# Patient Record
Sex: Male | Born: 1960 | Race: White | Hispanic: No | Marital: Married | Smoking: Never smoker
Health system: Southern US, Community
[De-identification: ages and names within clinical notes are randomized; demographics above are authoritative.]

---

## 2005-01-09 ENCOUNTER — Ambulatory Visit: Payer: Self-pay | Admitting: Internal Medicine

## 2005-01-19 ENCOUNTER — Ambulatory Visit (HOSPITAL_COMMUNITY): Admission: RE | Admit: 2005-01-19 | Discharge: 2005-01-19 | Payer: Self-pay | Admitting: Internal Medicine

## 2005-02-10 ENCOUNTER — Ambulatory Visit: Payer: Self-pay | Admitting: Internal Medicine

## 2005-02-11 ENCOUNTER — Ambulatory Visit (HOSPITAL_BASED_OUTPATIENT_CLINIC_OR_DEPARTMENT_OTHER): Admission: RE | Admit: 2005-02-11 | Discharge: 2005-02-11 | Payer: Self-pay | Admitting: Internal Medicine

## 2005-02-15 ENCOUNTER — Ambulatory Visit: Payer: Self-pay | Admitting: Internal Medicine

## 2005-02-24 ENCOUNTER — Ambulatory Visit: Payer: Self-pay | Admitting: Internal Medicine

## 2020-03-08 ENCOUNTER — Encounter: Payer: Self-pay | Admitting: Sports Medicine

## 2020-03-08 ENCOUNTER — Ambulatory Visit (INDEPENDENT_AMBULATORY_CARE_PROVIDER_SITE_OTHER): Payer: 59 | Admitting: Sports Medicine

## 2020-03-08 ENCOUNTER — Other Ambulatory Visit: Payer: Self-pay

## 2020-03-08 DIAGNOSIS — M170 Bilateral primary osteoarthritis of knee: Secondary | ICD-10-CM

## 2020-03-08 DIAGNOSIS — M1712 Unilateral primary osteoarthritis, left knee: Secondary | ICD-10-CM | POA: Insufficient documentation

## 2020-03-08 MED ORDER — CELECOXIB 200 MG PO CAPS: ORAL_CAPSULE | ORAL | 2 refills | Status: DC

## 2020-03-08 NOTE — Progress Notes (Signed)
    Procedures performed today:    Procedure: Real-time Ultrasound Guided aspiration/injection of left knee Device: Samsung HS60  Verbal informed consent obtained.  Time-out conducted.  Noted no overlying erythema, induration, or other signs of local infection.  Skin prepped in a sterile fashion.  Local anesthesia: Topical Ethyl chloride.  With sterile technique and under real time ultrasound guidance:  Using 18-gauge needle aspirated approximately 30 mL of clear, straw-colored fluid, syringe switched and 1 cc Kenalog 40, 2 cc lidocaine, 2 cc bupivacaine injected easily Completed without difficulty  Pain immediately resolved suggesting accurate placement of the medication.  Advised to call if fevers/chills, erythema, induration, drainage, or persistent bleeding.  Images permanently stored and available for review in the ultrasound unit.  Impression: Technically successful ultrasound guided injection.  Procedure: Real-time Ultrasound Guided aspiration/injection of right knee Device: Samsung HS60  Verbal informed consent obtained.  Time-out conducted.  Noted no overlying erythema, induration, or other signs of local infection.  Skin prepped in a sterile fashion.  Local anesthesia: Topical Ethyl chloride.  With sterile technique and under real time ultrasound guidance:  Using 18-gauge needle aspirated approximately 30 mL of clear, straw-colored fluid, syringe switched and 1 cc Kenalog 40, 2 cc lidocaine, 2 cc bupivacaine injected easily Completed without difficulty  Pain immediately resolved suggesting accurate placement of the medication.  Advised to call if fevers/chills, erythema, induration, drainage, or persistent bleeding.  Images permanently stored and available for review in the ultrasound unit.  Impression: Technically successful ultrasound guided injection.  Independent interpretation of notes and tests performed by another provider:   None.  Brief History, Exam,  Impression, and Recommendations:    No problem-specific Assessment & Plan notes found for this encounter.    ___________________________________________ [name]. [name], M.D., ABFM., CAQSM. Primary Care and Sports Medicine [facility]  Adjunct Instructor of Family Medicine  [employer]

## 2020-03-08 NOTE — Assessment & Plan Note (Signed)
This is a pleasant 59 year old male, he has a long history of bilateral knee pain, he has had x-rays in the past that showed osteoarthritis. He has had a couple of injections with questionable relief. Really has not done any NSAIDs or analgesics or therapy. He does have significant effusions today so we did aspirations and injections of both knees, I would like him to wear knee sleeves, also when to start Celebrex. Adding formal physical therapy. I like to see him back in a month and if no better we will get him approved for viscosupplementation. We also discussed the utility of PRP should he not respond to viscosupplementation. Ultimately knee arthroplasty is absolute last resort.

## 2020-03-21 ENCOUNTER — Ambulatory Visit: Payer: 59 | Admitting: Rehabilitative and Restorative Service Providers"

## 2020-04-01 ENCOUNTER — Ambulatory Visit: Payer: 59 | Admitting: Physical Therapy

## 2020-04-04 ENCOUNTER — Telehealth: Payer: Self-pay | Admitting: Sports Medicine

## 2020-04-04 ENCOUNTER — Ambulatory Visit (INDEPENDENT_AMBULATORY_CARE_PROVIDER_SITE_OTHER): Payer: 59 | Admitting: Sports Medicine

## 2020-04-04 ENCOUNTER — Other Ambulatory Visit: Payer: Self-pay

## 2020-04-04 DIAGNOSIS — M170 Bilateral primary osteoarthritis of knee: Secondary | ICD-10-CM | POA: Diagnosis not present

## 2020-04-04 MED ORDER — ACETAMINOPHEN ER 650 MG PO TBCR: 650.0000 mg | EXTENDED_RELEASE_TABLET | Freq: Three times a day (TID) | ORAL | 3 refills | Status: AC | PRN

## 2020-04-04 NOTE — Progress Notes (Signed)
    Procedures performed today:    None.  Independent interpretation of notes and tests performed by another provider:   None.  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 At the last visit we did bilateral aspirations and injections, he had good relief for a few weeks, he still has some relief but is having some recurrence of pain to the point where he cannot live with it. He will continue Celebrex which is helpful, add extended release acetaminophen high-dose while we get him approved for viscosupplementation. He can certainly call me if he would like tramadol as well. We did discuss the efficacy of viscosupplementation.     ___________________________________________ [name]. [name], M.D., ABFM., CAQSM. Primary Care and Sports Medicine [facility]  Adjunct Instructor of Family Medicine  [employer]

## 2020-04-04 NOTE — Telephone Encounter (Signed)
Please work on [employer], bilateral, x-ray confirmed, failed steroid injections and NSAIDs.

## 2020-04-04 NOTE — Assessment & Plan Note (Addendum)
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 At the last visit we did bilateral aspirations and injections, he had good relief for a few weeks, he still has some relief but is having some recurrence of pain to the point where he cannot live with it. He will continue Celebrex which is helpful, add extended release acetaminophen high-dose while we get him approved for viscosupplementation. He can certainly call me if he would like tramadol as well. We did discuss the efficacy of viscosupplementation.

## 2020-04-10 NOTE — Telephone Encounter (Signed)
Started Orthovisc Case for approval - CF

## 2020-05-01 MED ORDER — GELSYN-3 16.8 MG/2ML IX SOSY: 1.0000 | PREFILLED_SYRINGE | INTRA_ARTICULAR | 3 refills | Status: DC

## 2020-05-01 NOTE — Addendum Note (Signed)
Addended by: Monica Becton on: 05/01/2020 03:59 PM   Modules accepted: Orders

## 2020-05-01 NOTE — Telephone Encounter (Signed)
Wow, from a month-ish ago, Arline Asp were you not checking on these!?  Thank you Francesco Runner for looking into this.

## 2020-05-01 NOTE — Telephone Encounter (Signed)
Patient had called to check the status of his Orthovisc and I called insurance to see what the hold up was and he will have to try and fail Gelsyn-3 first. Can you please send Steven Dalton to [facility] speciality pharmacy? Please advise.

## 2020-05-03 NOTE — Telephone Encounter (Signed)
Insurance needed updated copy of insurance card and it was faxed.

## 2020-05-07 ENCOUNTER — Other Ambulatory Visit: Payer: Self-pay

## 2020-05-07 DIAGNOSIS — M170 Bilateral primary osteoarthritis of knee: Secondary | ICD-10-CM

## 2020-05-07 MED ORDER — GELSYN-3 16.8 MG/2ML IX SOSY: 1.0000 | PREFILLED_SYRINGE | INTRA_ARTICULAR | 3 refills | Status: DC

## 2020-05-07 NOTE — Telephone Encounter (Signed)
I called [facility] speciality pharmacy and per [facility] they are not a preferred pharmacy. I then was on the phone for about 20 minutes with Theron Arista who told me the speciality pharmacy was [facility]. Per Theron Arista no PA is needed for the injections and a new prescription was sent today. I called the patient and let him know what was going on at this time. No other questions.

## 2020-05-08 NOTE — Telephone Encounter (Signed)
I called and checked on the status of the order since I had received a PA. Per Selena Batten the notes say a PA was needed. I was then transferred to the PA line and then someone disconnected the call. Will try again later.

## 2020-05-09 NOTE — Telephone Encounter (Signed)
[facility] and the prescription has been approved and is set up to ship out to the office next week. Patient is aware and has given the approval to the pharmacy to ship the injections. No other questions.

## 2020-05-16 ENCOUNTER — Ambulatory Visit (INDEPENDENT_AMBULATORY_CARE_PROVIDER_SITE_OTHER): Payer: 59 | Admitting: Sports Medicine

## 2020-05-16 ENCOUNTER — Other Ambulatory Visit: Payer: Self-pay

## 2020-05-16 DIAGNOSIS — S0083XA Contusion of other part of head, initial encounter: Secondary | ICD-10-CM | POA: Diagnosis not present

## 2020-05-16 DIAGNOSIS — M170 Bilateral primary osteoarthritis of knee: Secondary | ICD-10-CM | POA: Diagnosis not present

## 2020-05-16 NOTE — Assessment & Plan Note (Signed)
We have had a fall recently, it sounds as though he became orthostatic. On further questioning he had a clear cardiac catheterization, he did have witnesses, there is no convulsive activity, tongue biting, or incontinence to suggest a seizure. He was taking his Flomax at the time of his fall and it sounds like he stood up too quickly. He will follow this up with his PCP, he could certainly use Cialis with a good Rx coupon for any symptoms of obstructive uropathy rather than Flomax. I did evacuate a large forehead hematoma today.

## 2020-05-16 NOTE — Assessment & Plan Note (Signed)
GelSyn-3 injection #1 of 3 into both knees after aspirations. Return in about 1 week for GelSyn-3 injection #2 of 3.

## 2020-05-16 NOTE — Progress Notes (Signed)
    Procedures performed today:    Procedure: Real-time Ultrasound Guided injection of the right knee Device: Samsung HS60  Verbal informed consent obtained.  Time-out conducted.  Noted no overlying erythema, induration, or other signs of local infection.  Skin prepped in a sterile fashion.  Local anesthesia: Topical Ethyl chloride.  With sterile technique and under real time ultrasound guidance:  Using an 18-gauge needle aspirated 35 cc of clear, straw-colored fluid, syringe switched and 1 syringe of GelSyn-3 injected through a 18-gauge needle.   Completed without difficulty  Pain immediately resolved suggesting accurate placement of the medication.  Advised to call if fevers/chills, erythema, induration, drainage, or persistent bleeding.  Images permanently stored and available for review in the ultrasound unit.  Impression: Technically successful ultrasound guided injection.  Procedure: Real-time Ultrasound Guided injection of the left knee Device: Samsung HS60  Verbal informed consent obtained.  Time-out conducted.  Noted no overlying erythema, induration, or other signs of local infection.  Skin prepped in a sterile fashion.  Local anesthesia: Topical Ethyl chloride.  With sterile technique and under real time ultrasound guidance:  Using an 18-gauge needle aspirated 55 cc of clear, straw-colored fluid, syringe switched and 1 syringe of GelSyn-3 injected through a 18-gauge needle.   Completed without difficulty  Pain immediately resolved suggesting accurate placement of the medication.  Advised to call if fevers/chills, erythema, induration, drainage, or persistent bleeding.  Images permanently stored and available for review in the ultrasound unit.  Impression: Technically successful ultrasound guided injection.  Hematoma evacuation right forehead Risks, benefits, and alternatives explained and consent obtained. Time out conducted. Surface cleaned with alcohol. 3cc lidocaine  with epinephine infiltrated around hematoma. Adequate anesthesia ensured. Area prepped and draped in a sterile fashion. #11 blade used to make a stab incision into hematoma. Curved hemostat used to explore 4 quadrants and loculations broken up. A large amount of congealed hematoma was expressed, the incision was closed with Dermabond. Hemostasis achieved. Pt stable. Compressive head wrap applied. Aftercare and follow-up advised.  Independent interpretation of notes and tests performed by another provider:   None.  Brief History, Exam, Impression, and Recommendations:    Primary osteoarthritis of both knees GelSyn-3 injection #1 of 3 into both knees after aspirations. Return in about 1 week for GelSyn-3 injection #2 of 3.  Traumatic hematoma of forehead We have had a fall recently, it sounds as though he became orthostatic. On further questioning he had a clear cardiac catheterization, he did have witnesses, there is no convulsive activity, tongue biting, or incontinence to suggest a seizure. He was taking his Flomax at the time of his fall and it sounds like he stood up too quickly. He will follow this up with his PCP, he could certainly use Cialis with a good Rx coupon for any symptoms of obstructive uropathy rather than Flomax. I did evacuate a large forehead hematoma today.    ___________________________________________ Steven Dalton. Steven Dalton, M.D., ABFM., CAQSM. Primary Care and Sports Medicine [facility]  Adjunct Instructor of Family Medicine  [employer]

## 2020-05-30 ENCOUNTER — Ambulatory Visit (INDEPENDENT_AMBULATORY_CARE_PROVIDER_SITE_OTHER): Payer: 59

## 2020-05-30 ENCOUNTER — Ambulatory Visit (INDEPENDENT_AMBULATORY_CARE_PROVIDER_SITE_OTHER): Payer: 59 | Admitting: Sports Medicine

## 2020-05-30 ENCOUNTER — Other Ambulatory Visit: Payer: Self-pay

## 2020-05-30 DIAGNOSIS — S0083XD Contusion of other part of head, subsequent encounter: Secondary | ICD-10-CM | POA: Diagnosis not present

## 2020-05-30 DIAGNOSIS — M170 Bilateral primary osteoarthritis of knee: Secondary | ICD-10-CM

## 2020-05-30 MED ORDER — TRAMADOL HCL 50 MG PO TABS: 50.0000 mg | ORAL_TABLET | Freq: Three times a day (TID) | ORAL | 0 refills | Status: DC | PRN

## 2020-05-30 NOTE — Assessment & Plan Note (Signed)
At the last visit I performed an incision and evacuation of a traumatic forehead hematoma, this is doing extremely well.

## 2020-05-30 NOTE — Assessment & Plan Note (Addendum)
Aspiration and GelSyn-3 injection #2 of 3 both knees, return in 1 week for #3 of 3. We will also be bumping him up to tramadol for pain

## 2020-05-30 NOTE — Progress Notes (Addendum)
    Procedures performed today:    Procedure: Real-time Ultrasound Guidedinjection of the right knee Device: Samsung HS60  Verbal informed consent obtained.  Time-out conducted.  Noted no overlying erythema, induration, or other signs of local infection.  Skin prepped in a sterile fashion.  Local anesthesia: Topical Ethyl chloride.  With sterile technique and under real time ultrasound guidance: Using an 18-gauge needle aspirated 25 cc of clear, straw-colored fluid, syringe switched and 1 syringe of GelSyn-3 injected through a 18-gauge needle.   Completed without difficulty  Pain immediately resolved suggesting accurate placement of the medication.  Advised to call if fevers/chills, erythema, induration, drainage, or persistent bleeding.  Images permanently stored and available for review in the ultrasound unit.  Impression: Technically successful ultrasound guided injection.  Procedure: Real-time Ultrasound Guidedinjection of the left knee Device: Samsung HS60  Verbal informed consent obtained.  Time-out conducted.  Noted no overlying erythema, induration, or other signs of local infection.  Skin prepped in a sterile fashion.  Local anesthesia: Topical Ethyl chloride.  With sterile technique and under real time ultrasound guidance: Using an 18-gauge needle aspirated 45 cc of clear, straw-colored fluid, syringe switched and 1 syringe of GelSyn-3 injected through a 18-gauge needle.   Completed without difficulty  Pain immediately resolved suggesting accurate placement of the medication.  Advised to call if fevers/chills, erythema, induration, drainage, or persistent bleeding.  Images permanently stored and available for review in the ultrasound unit.  Impression: Technically successful ultrasound guided injection.  Independent interpretation of notes and tests performed by another provider:   None.  Brief History, Exam, Impression, and Recommendations:    Primary  osteoarthritis of both knees Aspiration and GelSyn-3 injection #2 of 3 both knees, return in 1 week for #3 of 3. We will also be bumping him up to tramadol for pain  Traumatic hematoma of forehead At the last visit I performed an incision and evacuation of a traumatic forehead hematoma, this is doing extremely well.    ___________________________________________ [name]. [name], M.D., ABFM., CAQSM. Primary Care and Sports Medicine [facility]  Adjunct Instructor of Family Medicine  [employer]

## 2020-05-30 NOTE — Addendum Note (Signed)
Addended by: Monica Becton on: 05/30/2020 02:17 PM   Modules accepted: Orders

## 2020-06-06 ENCOUNTER — Ambulatory Visit (INDEPENDENT_AMBULATORY_CARE_PROVIDER_SITE_OTHER): Payer: 59 | Admitting: Sports Medicine

## 2020-06-06 ENCOUNTER — Ambulatory Visit (INDEPENDENT_AMBULATORY_CARE_PROVIDER_SITE_OTHER): Payer: 59

## 2020-06-06 DIAGNOSIS — M170 Bilateral primary osteoarthritis of knee: Secondary | ICD-10-CM

## 2020-06-06 NOTE — Progress Notes (Signed)
    Procedures performed today:    Procedure: Real-time Ultrasound Guidedinjection of theright knee Device: Samsung HS60  Verbal informed consent obtained.  Time-out conducted.  Noted no overlying erythema, induration, or other signs of local infection.  Skin prepped in a sterile fashion.  Local anesthesia: Topical Ethyl chloride.  With sterile technique and under real time ultrasound guidance:Using an 18-gauge needle aspirated 32 cc of clear, straw-colored fluid, syringe switched and 1 syringe of GelSyn-3 injected through a 18-gauge needle. Completed without difficulty  Pain immediately resolved suggesting accurate placement of the medication.  Advised to call if fevers/chills, erythema, induration, drainage, or persistent bleeding.  Images permanently stored and available for review in the ultrasound unit.  Impression: Technically successful ultrasound guided injection.  Procedure: Real-time Ultrasound Guidedinjection of theleftknee Device: Samsung HS60  Verbal informed consent obtained.  Time-out conducted.  Noted no overlying erythema, induration, or other signs of local infection.  Skin prepped in a sterile fashion.  Local anesthesia: Topical Ethyl chloride.  With sterile technique and under real time ultrasound guidance:Using an 18-gauge needle aspirated 36 cc of clear, straw-colored fluid, syringe switched and 1 syringe of GelSyn-3 injected through a18-gauge needle. Completed without difficulty  Pain immediately resolved suggesting accurate placement of the medication.  Advised to call if fevers/chills, erythema, induration, drainage, or persistent bleeding.  Images permanently stored and available for review in the ultrasound unit.  Impression: Technically successful ultrasound guided injection.  Independent interpretation of notes and tests performed by another provider:   None.  Brief History, Exam, Impression, and Recommendations:    Primary  osteoarthritis of both knees Aspiration and GelSyn-3 injection #3 of 3 into both knees, return in 1 month, feeling about the same. He does understand that knee arthroplasty is the next step if this does not improve his symptoms sufficiently.    ___________________________________________ Steven Dalton. [name], M.D., ABFM., CAQSM. Primary Care and Sports Medicine [facility] [facility]  Adjunct Instructor of Family Medicine  [employer]

## 2020-06-06 NOTE — Assessment & Plan Note (Signed)
Aspiration and GelSyn-3 injection #3 of 3 into both knees, return in 1 month, feeling about the same. He does understand that knee arthroplasty is the next step if this does not improve his symptoms sufficiently.

## 2020-06-07 ENCOUNTER — Other Ambulatory Visit: Payer: Self-pay | Admitting: Sports Medicine

## 2020-06-07 DIAGNOSIS — M170 Bilateral primary osteoarthritis of knee: Secondary | ICD-10-CM

## 2020-07-04 ENCOUNTER — Ambulatory Visit: Payer: 59 | Admitting: Sports Medicine

## 2020-07-12 ENCOUNTER — Ambulatory Visit (INDEPENDENT_AMBULATORY_CARE_PROVIDER_SITE_OTHER): Payer: 59 | Admitting: Sports Medicine

## 2020-07-12 ENCOUNTER — Ambulatory Visit (INDEPENDENT_AMBULATORY_CARE_PROVIDER_SITE_OTHER): Payer: 59

## 2020-07-12 DIAGNOSIS — M170 Bilateral primary osteoarthritis of knee: Secondary | ICD-10-CM | POA: Diagnosis not present

## 2020-07-12 NOTE — Assessment & Plan Note (Signed)
We tried everything with Steven Dalton, he has had steroid injections as well as GelSyn-3 series, still feeling about the same, we performed bilateral aspirations and injections with steroid today, referring him for genicular radiofrequency ablation with Dr. Laurian Brim, he is adamant to stay away from arthroplasty.

## 2020-07-12 NOTE — Progress Notes (Signed)
    Procedures performed today:    Procedure: Real-time Ultrasound Guided aspiration/injection of left knee Device: Samsung HS60  Verbal informed consent obtained.  Time-out conducted.  Noted no overlying erythema, induration, or other signs of local infection.  Skin prepped in a sterile fashion.  Local anesthesia: Topical Ethyl chloride.  With sterile technique and under real time ultrasound guidance:  Using 18-gauge needle aspirated 60 cc of blood, syringe switched and 1 cc Kenalog 40, 2 cc lidocaine, 2 cc bupivacaine injected easily Completed without difficulty  Pain immediately resolved suggesting accurate placement of the medication.  Advised to call if fevers/chills, erythema, induration, drainage, or persistent bleeding.  Images permanently stored and available for review in PACS.  Impression: Technically successful ultrasound guided injection.  Procedure: Real-time Ultrasound Guided aspiration/injection of right knee Device: Samsung HS60  Verbal informed consent obtained.  Time-out conducted.  Noted no overlying erythema, induration, or other signs of local infection.  Skin prepped in a sterile fashion.  Local anesthesia: Topical Ethyl chloride.  With sterile technique and under real time ultrasound guidance:  Using 18-gauge needle aspirated 20 mL of clear, straw-colored fluid, syringe switched and 1 cc Kenalog 40, 2 cc lidocaine, 2 cc bupivacaine injected easily Completed without difficulty  Pain immediately resolved suggesting accurate placement of the medication.  Advised to call if fevers/chills, erythema, induration, drainage, or persistent bleeding.  Images permanently stored and available for review in PACS.  Impression: Technically successful ultrasound guided injection.  Independent interpretation of notes and tests performed by another provider:   None.  Brief History, Exam, Impression, and Recommendations:    Primary osteoarthritis of both knees We tried  everything with Theodoro Grist, he has had steroid injections as well as GelSyn-3 series, still feeling about the same, we performed bilateral aspirations and injections with steroid today, referring him for genicular radiofrequency ablation with Dr. Laurian Brim, he is adamant to stay away from arthroplasty.    ___________________________________________ [name]. [name], M.D., ABFM., CAQSM. Primary Care and Sports Medicine [place] [facility]  Adjunct Instructor of Family Medicine  [employer]

## 2020-09-02 ENCOUNTER — Other Ambulatory Visit: Payer: Self-pay | Admitting: Sports Medicine

## 2020-09-02 DIAGNOSIS — M170 Bilateral primary osteoarthritis of knee: Secondary | ICD-10-CM

## 2020-12-07 ENCOUNTER — Other Ambulatory Visit: Payer: Self-pay | Admitting: Sports Medicine

## 2020-12-07 DIAGNOSIS — M170 Bilateral primary osteoarthritis of knee: Secondary | ICD-10-CM

## 2021-02-07 ENCOUNTER — Other Ambulatory Visit: Payer: Self-pay

## 2021-02-07 ENCOUNTER — Ambulatory Visit (INDEPENDENT_AMBULATORY_CARE_PROVIDER_SITE_OTHER): Payer: 59

## 2021-02-07 ENCOUNTER — Ambulatory Visit (INDEPENDENT_AMBULATORY_CARE_PROVIDER_SITE_OTHER): Payer: 59 | Admitting: Sports Medicine

## 2021-02-07 DIAGNOSIS — M170 Bilateral primary osteoarthritis of knee: Secondary | ICD-10-CM

## 2021-02-07 NOTE — Assessment & Plan Note (Signed)
This is a pleasant 60 year old male with known bilateral knee osteoarthritis, previously aspirated and injected in October of last year, did well until recently, now with recurrence of pain, stiffness, swelling, repeat bilateral aspiration and injection today, return to see me on an as-needed basis.

## 2021-02-07 NOTE — Progress Notes (Signed)
    Procedures performed today:    Procedure: Real-time Ultrasound Guidedaspiration/injection of left knee Device: Samsung HS60  Verbal informed consent obtained.  Time-out conducted.  Noted no overlying erythema, induration, or other signs of local infection.  Skin prepped in a sterile fashion.  Local anesthesia: Topical Ethyl chloride.  With sterile technique and under real time ultrasound guidance:  Noted effusion.  Using 18-gauge needle aspirated 31 cc of blood, syringe switched and 1 cc Kenalog 40, 2 cc lidocaine, 2 cc bupivacaine injected easily Completed without difficulty  Pain immediately resolved suggesting accurate placement of the medication.  Advised to call if fevers/chills, erythema, induration, drainage, or persistent bleeding.  Images permanently stored and available for review in PACS.  Impression: Technically successful ultrasound guided injection.  Procedure: Real-time Ultrasound Guidedaspiration/injection of right knee Device: Samsung HS60  Verbal informed consent obtained.  Time-out conducted.  Noted no overlying erythema, induration, or other signs of local infection.  Skin prepped in a sterile fashion.  Local anesthesia: Topical Ethyl chloride.  With sterile technique and under real time ultrasound guidance:  Noted effusion.  Using 18-gauge needle aspirated 35 mL of clear, straw-colored fluid, syringe switched and 1 cc Kenalog 40, 2 cc lidocaine, 2 cc bupivacaine injected easily Completed without difficulty  Pain immediately resolved suggesting accurate placement of the medication.  Advised to call if fevers/chills, erythema, induration, drainage, or persistent bleeding.  Images permanently stored and available for review in PACS.  Impression: Technically successful ultrasound guided injection.  Independent interpretation of notes and tests performed by another provider:   None.  Brief History, Exam, Impression, and Recommendations:    Primary  osteoarthritis of both knees This is a pleasant 60 year old male with known bilateral knee osteoarthritis, previously aspirated and injected in October of last year, did well until recently, now with recurrence of pain, stiffness, swelling, repeat bilateral aspiration and injection today, return to see me on an as-needed basis.    ___________________________________________ [name]. [name], M.D., ABFM., CAQSM. Primary Care and Sports Medicine [facility] [facility]  Adjunct Instructor of Family Medicine  [employer]

## 2021-02-07 NOTE — Patient Instructions (Signed)
Talk to your primary care provider about phentermine and Plenity

## 2021-03-31 ENCOUNTER — Other Ambulatory Visit: Payer: Self-pay | Admitting: Sports Medicine

## 2021-03-31 DIAGNOSIS — M170 Bilateral primary osteoarthritis of knee: Secondary | ICD-10-CM

## 2021-06-19 ENCOUNTER — Other Ambulatory Visit: Payer: Self-pay

## 2021-06-19 ENCOUNTER — Ambulatory Visit (INDEPENDENT_AMBULATORY_CARE_PROVIDER_SITE_OTHER): Payer: 59 | Admitting: Sports Medicine

## 2021-06-19 ENCOUNTER — Ambulatory Visit (INDEPENDENT_AMBULATORY_CARE_PROVIDER_SITE_OTHER): Payer: 59

## 2021-06-19 DIAGNOSIS — M170 Bilateral primary osteoarthritis of knee: Secondary | ICD-10-CM

## 2021-06-19 DIAGNOSIS — G5603 Carpal tunnel syndrome, bilateral upper limbs: Secondary | ICD-10-CM

## 2021-06-19 NOTE — Progress Notes (Signed)
    Procedures performed today:    Procedure: Real-time Ultrasound Guided aspiration/injection of left knee Device: Samsung HS60  Verbal informed consent obtained.  Time-out conducted.  Noted no overlying erythema, induration, or other signs of local infection.  Skin prepped in a sterile fashion.  Local anesthesia: Topical Ethyl chloride.  With sterile technique and under real time ultrasound guidance:   Noted effusion.  Using 18-gauge needle aspirated 20 mL of clear, straw-colored fluid, syringe switched and 1 cc Kenalog 40, 2 cc lidocaine, 2 cc bupivacaine injected easily Completed without difficulty  Pain immediately resolved suggesting accurate placement of the medication.  Advised to call if fevers/chills, erythema, induration, drainage, or persistent bleeding.  Images permanently stored and available for review in PACS.  Impression: Technically successful ultrasound guided injection.   Procedure: Real-time Ultrasound Guided aspiration/injection of right knee Device: Samsung HS60  Verbal informed consent obtained.  Time-out conducted.  Noted no overlying erythema, induration, or other signs of local infection.  Skin prepped in a sterile fashion.  Local anesthesia: Topical Ethyl chloride.  With sterile technique and under real time ultrasound guidance:   Noted effusion.  Using 18-gauge needle aspirated 25 mL of clear, straw-colored fluid, syringe switched and 1 cc Kenalog 40, 2 cc lidocaine, 2 cc bupivacaine injected easily Completed without difficulty  Pain immediately resolved suggesting accurate placement of the medication.  Advised to call if fevers/chills, erythema, induration, drainage, or persistent bleeding.  Images permanently stored and available for review in PACS.  Impression: Technically successful ultrasound guided injection.  Independent interpretation of notes and tests performed by another provider:   None.  Brief History, Exam, Impression, and  Recommendations:    Primary osteoarthritis of both knees Steven Dalton is a pleasant 60 year old male, he has known knee osteoarthritis, last aspirated and injected in April of this year, he also saw Dr. Laurian Brim for genicular radiofrequency ablation. He is having recurrence of swelling with minimal pain, he is going on vacation and would like repeat aspiration injection today, this was performed bilaterally, return to see me as needed.  Carpal tunnel syndrome, bilateral Long history of hands going numb when typing and riding his motorcycle, he will do nighttime splinting for 6 weeks, carpal tunnel conditioning exercises. If insufficient improvement we can consider bilateral median nerve Hydro dissections.    ___________________________________________ [name]. [name], M.D., ABFM., CAQSM. Primary Care and Sports Medicine  [facility]  Adjunct Instructor of Family Medicine  [employer]

## 2021-06-19 NOTE — Assessment & Plan Note (Signed)
Steven Dalton is a pleasant 60 year old male, he has known knee osteoarthritis, last aspirated and injected in April of this year, he also saw Dr. Laurian Brim for genicular radiofrequency ablation. He is having recurrence of swelling with minimal pain, he is going on vacation and would like repeat aspiration injection today, this was performed bilaterally, return to see me as needed.

## 2021-06-19 NOTE — Assessment & Plan Note (Signed)
Long history of hands going numb when typing and riding his motorcycle, he will do nighttime splinting for 6 weeks, carpal tunnel conditioning exercises. If insufficient improvement we can consider bilateral median nerve Hydro dissections.

## 2021-06-30 ENCOUNTER — Other Ambulatory Visit: Payer: Self-pay | Admitting: Sports Medicine

## 2021-06-30 DIAGNOSIS — M170 Bilateral primary osteoarthritis of knee: Secondary | ICD-10-CM

## 2021-08-27 ENCOUNTER — Ambulatory Visit (INDEPENDENT_AMBULATORY_CARE_PROVIDER_SITE_OTHER): Payer: 59

## 2021-08-27 ENCOUNTER — Ambulatory Visit (INDEPENDENT_AMBULATORY_CARE_PROVIDER_SITE_OTHER): Payer: 59 | Admitting: Sports Medicine

## 2021-08-27 ENCOUNTER — Other Ambulatory Visit: Payer: Self-pay

## 2021-08-27 DIAGNOSIS — M170 Bilateral primary osteoarthritis of knee: Secondary | ICD-10-CM

## 2021-08-27 NOTE — Assessment & Plan Note (Addendum)
Known bilateral knee osteoarthritis, multiple aspirations and injections in the past, we did set him up with Dr. Laurian Brim for genicular radiofrequency ablation, he did have some relief, but not long-lasting, I would like him to talk to Dr. Laurian Brim again about repeat ablation, hopefully we can get longer lasting effect. I did aspirate his right knee, no injection as we just did this 2 months ago. I would also like a second opinion from one of our surgeons. We did discuss viscosupplementation and PRP injection as alternatives.

## 2021-08-27 NOTE — Progress Notes (Signed)
    Procedures performed today:    Procedure: Real-time Ultrasound Guided aspiration of right knee Device: Samsung HS60  Verbal informed consent obtained.  Time-out conducted.  Noted no overlying erythema, induration, or other signs of local infection.  Skin prepped in a sterile fashion.  Local anesthesia: Topical Ethyl chloride.  With sterile technique and under real time ultrasound guidance: Noted effusion, using 18-gauge needle aspirated 22 mils of clear, straw-colored fluid. Completed without difficulty  Advised to call if fevers/chills, erythema, induration, drainage, or persistent bleeding.  Images permanently stored and available for review in PACS.  Impression: Technically successful ultrasound guided therapeutic aspiration.  Independent interpretation of notes and tests performed by another provider:   None.  Brief History, Exam, Impression, and Recommendations:    Primary osteoarthritis of both knees Known bilateral knee osteoarthritis, multiple aspirations and injections in the past, we did set him up with Dr. Laurian Brim for genicular radiofrequency ablation, he did have some relief, but not long-lasting, I would like him to talk to Dr. Laurian Brim again about repeat ablation, hopefully we can get longer lasting effect. I did aspirate his right knee, no injection as we just did this 2 months ago. I would also like a second opinion from one of our surgeons. We did discuss viscosupplementation and PRP injection as alternatives.    ___________________________________________ [name]. [name], M.D., ABFM., CAQSM. Primary Care and Sports Medicine [place] [facility]  Adjunct Instructor of Family Medicine  [employer]

## 2021-10-05 ENCOUNTER — Other Ambulatory Visit: Payer: Self-pay | Admitting: Sports Medicine

## 2021-10-05 DIAGNOSIS — M170 Bilateral primary osteoarthritis of knee: Secondary | ICD-10-CM

## 2022-01-07 ENCOUNTER — Other Ambulatory Visit: Payer: Self-pay | Admitting: Sports Medicine

## 2022-01-07 DIAGNOSIS — M170 Bilateral primary osteoarthritis of knee: Secondary | ICD-10-CM

## 2022-02-02 ENCOUNTER — Ambulatory Visit (INDEPENDENT_AMBULATORY_CARE_PROVIDER_SITE_OTHER): Payer: 59 | Admitting: Sports Medicine

## 2022-02-02 ENCOUNTER — Ambulatory Visit (INDEPENDENT_AMBULATORY_CARE_PROVIDER_SITE_OTHER): Payer: 59

## 2022-02-02 ENCOUNTER — Telehealth: Payer: Self-pay | Admitting: Sports Medicine

## 2022-02-02 DIAGNOSIS — M170 Bilateral primary osteoarthritis of knee: Secondary | ICD-10-CM

## 2022-02-02 NOTE — Progress Notes (Signed)
? ? ?  Procedures performed today:   ? ?Procedure: Real-time Ultrasound Guided aspiration of right knee ?Device: Samsung HS60  ?Verbal informed consent obtained.  ?Time-out conducted.  ?Noted no overlying erythema, induration, or other signs of local infection.  ?Skin prepped in a sterile fashion.  ?Local anesthesia: Topical Ethyl chloride.  ?With sterile technique and under real time ultrasound guidance: Noted effusion, using 18-gauge needle aspirated 40 mils of clear, straw-colored fluid, syringe switched and 1 cc kenalog 40, 2 cc lidocaine, 2 cc bupivacaine injected easily. ?Completed without difficulty  ?Advised to call if fevers/chills, erythema, induration, drainage, or persistent bleeding.  ?Images permanently stored and available for review in PACS.  ?Impression: Technically successful ultrasound guided therapeutic aspiration/injection. ? ?Procedure: Real-time Ultrasound Guided injection of left knee ?Device: Samsung HS60  ?Verbal informed consent obtained.  ?Time-out conducted.  ?Noted no overlying erythema, induration, or other signs of local infection.  ?Skin prepped in a sterile fashion.  ?Local anesthesia: Topical Ethyl chloride.  ?With sterile technique and under real time ultrasound guidance: Noted trace effusion, 1 cc kenalog 40, 2 cc lidocaine, 2 cc bupivacaine injected easily. ?Completed without difficulty  ?Advised to call if fevers/chills, erythema, induration, drainage, or persistent bleeding.  ?Images permanently stored and available for review in PACS.  ?Impression: Technically successful ultrasound guided therapeutic injection. ? ?Independent interpretation of notes and tests performed by another provider:  ? ?None. ? ?Brief History, Exam, Impression, and Recommendations:   ? ?Primary osteoarthritis of both knees ?Bilateral knee injection today, last done in November 2022. ?He is interested in viscosupplementation bilateral, for approval purposes he has had greater than 6 weeks of physician  directed conservative treatment, bilateral x-ray confirmation of the arthritis, steroid injections, NSAIDs all without sufficient improvement, we can start Orthovisc or other viscosupplement when approved.  ? ? ? ? ?___________________________________________ ?Steven Dalton. Steven Dalton, M.D., ABFM., CAQSM. ?Primary Care and Sports Medicine ?[facility] ? ?Adjunct Instructor of Family Medicine  ?[employer] ?

## 2022-02-02 NOTE — Assessment & Plan Note (Addendum)
Bilateral knee injection today, last done in November 2022. ?He is interested in viscosupplementation bilateral, for approval purposes he has had greater than 6 weeks of physician directed conservative treatment, bilateral x-ray confirmation of the arthritis, steroid injections, NSAIDs all without sufficient improvement, we can start Orthovisc or other viscosupplement when approved.  ? ?

## 2022-02-02 NOTE — Telephone Encounter (Signed)
Please work on [name]. He is interested in viscosupplementation bilateral, for approval purposes he has had greater than 6 weeks of physician directed conservative treatment, bilateral x-ray confirmation of the arthritis, steroid injections, NSAIDs all without sufficient improvement. ?

## 2022-02-03 NOTE — Telephone Encounter (Signed)
[employer] paperwork faxed to [employer] at [phone] °Request is for Orthovisc or Monovisc °Pt's insurance prefers Orthovisc °Fax confirmation receipt received  °

## 2022-02-04 MED ORDER — EUFLEXXA 20 MG/2ML IX SOSY: PREFILLED_SYRINGE | INTRA_ARTICULAR | 0 refills | Status: AC

## 2022-02-04 NOTE — Telephone Encounter (Signed)
Benefits Investigation Details received from [employer] ?Injection: Euflexxa ? ?Medical: Deductible applies. Since the deductible has been met, patient is responsible for coinsurance. Once the OOP has been met, patient is covered at 100%. Prior authorization is required for the drug through [facility]. To initiate, submit online at [email or web address] ? ?PA required: Yes ?PA submitted to [email or web address]; approval received. Auth # [id number]  ? ? ?Pharmacy: The product is not covered under the pharmacy plan.  ? ?Specialty Pharmacy: [facility] ? ?May fill through: Specialty Pharmacy ?OV Copay/Coinsurance: 0% ?Product Copay: 0% ?Administration Coinsurance: 0% ?Administration Copay:  ?Deductible: [id number] (met: $0289) ?Out of Pocket Max: $3300 (met: $3300)   ? ?Prescription queued up to send to specialty pharmacy, please review for accuracy prior to sending. He is bilateral injections.  ?

## 2022-02-04 NOTE — Addendum Note (Signed)
Addended by: Silverio Decamp on: 02/04/2022 01:02 PM ? ? Modules accepted: Orders ? ?

## 2022-02-04 NOTE — Telephone Encounter (Signed)
Sent!

## 2022-02-04 NOTE — Addendum Note (Signed)
Addended by: Carolin Coy on: 02/04/2022 12:39 PM ? ? Modules accepted: Orders ? ?

## 2022-02-26 NOTE — Telephone Encounter (Signed)
Spoke with specialty pharmacy. Gave patient contact number to reach pharmacy for shipment authorization.

## 2022-03-18 ENCOUNTER — Ambulatory Visit (INDEPENDENT_AMBULATORY_CARE_PROVIDER_SITE_OTHER): Payer: 59

## 2022-03-18 ENCOUNTER — Ambulatory Visit (INDEPENDENT_AMBULATORY_CARE_PROVIDER_SITE_OTHER): Payer: 59 | Admitting: Sports Medicine

## 2022-03-18 DIAGNOSIS — M170 Bilateral primary osteoarthritis of knee: Secondary | ICD-10-CM | POA: Diagnosis not present

## 2022-03-18 NOTE — Assessment & Plan Note (Signed)
Steven Dalton returns, he is a very pleasant 61 year old male, he last had a bilateral knee steroid injection in April of this year, we got him approved for Visco and we got his Euflexxa. Euflexxa injection #1 of 3 into both knees, return in 1 week for #2 of 3.

## 2022-03-18 NOTE — Progress Notes (Signed)
    Procedures performed today:    Procedure: Real-time Ultrasound Guided injection of the left knee Device: Samsung HS60  Verbal informed consent obtained.  Time-out conducted.  Noted no overlying erythema, induration, or other signs of local infection.  Skin prepped in a sterile fashion.  Local anesthesia: Topical Ethyl chloride.  With sterile technique and under real time ultrasound guidance: Noted mild effusion, 1 syringe of Euflexxa injected into the suprapatellar recess via a 22-gauge needle. Completed without difficulty  Advised to call if fevers/chills, erythema, induration, drainage, or persistent bleeding.  Images permanently stored and available for review in PACS.  Impression: Technically successful ultrasound guided injection.  Procedure: Real-time Ultrasound Guided aspiration/injection of the right knee Device: Samsung HS60  Verbal informed consent obtained.  Time-out conducted.  Noted no overlying erythema, induration, or other signs of local infection.  Skin prepped in a sterile fashion.  Local anesthesia: Topical Ethyl chloride.  With sterile technique and under real time ultrasound guidance: Noted large effusion, aspirated 40 mL of clear, straw-colored fluid, syringe switched and 1 syringe of Euflexxa injected into the suprapatellar recess. Completed without difficulty  Advised to call if fevers/chills, erythema, induration, drainage, or persistent bleeding.  Images permanently stored and available for review in PACS.  Impression: Technically successful ultrasound guided aspiration/injection.  Independent interpretation of notes and tests performed by another provider:   None.  Brief History, Exam, Impression, and Recommendations:    Primary osteoarthritis of both knees Steven Dalton returns, he is a very pleasant 61 year old male, he last had a bilateral knee steroid injection in April of this year, we got him approved for [facility] and we got his Euflexxa. Euflexxa  injection #1 of 3 into both knees, return in 1 week for #2 of 3.    ___________________________________________ [name]. [name], M.D., ABFM., CAQSM. Primary Care and Sports Medicine [place] [facility]  Adjunct Instructor of Family Medicine  [employer] [facility] [employer]

## 2022-04-01 ENCOUNTER — Ambulatory Visit (INDEPENDENT_AMBULATORY_CARE_PROVIDER_SITE_OTHER): Payer: 59 | Admitting: Sports Medicine

## 2022-04-01 ENCOUNTER — Ambulatory Visit (INDEPENDENT_AMBULATORY_CARE_PROVIDER_SITE_OTHER): Payer: 59

## 2022-04-01 DIAGNOSIS — M170 Bilateral primary osteoarthritis of knee: Secondary | ICD-10-CM

## 2022-04-01 NOTE — Progress Notes (Signed)
    Procedures performed today:    Procedure: Real-time Ultrasound Guided injection of the left knee Device: Samsung HS60  Verbal informed consent obtained.  Time-out conducted.  Noted no overlying erythema, induration, or other signs of local infection.  Skin prepped in a sterile fashion.  Local anesthesia: Topical Ethyl chloride.  With sterile technique and under real time ultrasound guidance: Noted mild effusion, 1 syringe of Euflexxa injected into the suprapatellar recess via a 22-gauge needle. Completed without difficulty  Advised to call if fevers/chills, erythema, induration, drainage, or persistent bleeding.  Images permanently stored and available for review in PACS.  Impression: Technically successful ultrasound guided injection.   Procedure: Real-time Ultrasound Guided aspiration/injection of the right knee Device: Samsung HS60  Verbal informed consent obtained.  Time-out conducted.  Noted no overlying erythema, induration, or other signs of local infection.  Skin prepped in a sterile fashion.  Local anesthesia: Topical Ethyl chloride.  With sterile technique and under real time ultrasound guidance: Noted large effusion, aspirated 35 mL of clear, straw-colored fluid, syringe switched and 1 syringe of Euflexxa injected into the suprapatellar recess. Completed without difficulty  Advised to call if fevers/chills, erythema, induration, drainage, or persistent bleeding.  Images permanently stored and available for review in PACS.  Impression: Technically successful ultrasound guided aspiration/injection.  Independent interpretation of notes and tests performed by another provider:   None.  Brief History, Exam, Impression, and Recommendations:    Primary osteoarthritis of both knees Euflexxa 2 of 3 both knees, return in 1 week for #3 of 3 both knees. Still having significant pain, we discussed some strategies to maximize medical management including Celebrex twice a day  and Tylenol arthritis strength twice a day before considering tramadol at the follow-up visit. If Euflexxa does not work then we will be stuck between a rock and a hard place as he cannot get an arthroplasty until his BMI is less than 40. Genicular RFA would be an option.    ___________________________________________ [name]. [name], M.D., ABFM., CAQSM. Primary Care and Sports Medicine [place] [facility]  Adjunct Instructor of Family Medicine  [employer]

## 2022-04-01 NOTE — Assessment & Plan Note (Addendum)
Euflexxa 2 of 3 both knees, return in 1 week for #3 of 3 both knees. Still having significant pain, we discussed some strategies to maximize medical management including Celebrex twice a day and Tylenol arthritis strength twice a day before considering tramadol at the follow-up visit. If Euflexxa does not work then we will be stuck between a rock and a hard place as he cannot get an arthroplasty until his BMI is less than 40. Genicular RFA would be an option.

## 2022-04-06 ENCOUNTER — Other Ambulatory Visit: Payer: Self-pay | Admitting: Sports Medicine

## 2022-04-06 DIAGNOSIS — M170 Bilateral primary osteoarthritis of knee: Secondary | ICD-10-CM

## 2022-04-08 ENCOUNTER — Ambulatory Visit (INDEPENDENT_AMBULATORY_CARE_PROVIDER_SITE_OTHER): Payer: 59 | Admitting: Sports Medicine

## 2022-04-08 ENCOUNTER — Ambulatory Visit (INDEPENDENT_AMBULATORY_CARE_PROVIDER_SITE_OTHER): Payer: 59

## 2022-04-08 DIAGNOSIS — M170 Bilateral primary osteoarthritis of knee: Secondary | ICD-10-CM | POA: Diagnosis not present

## 2022-04-08 MED ORDER — TRAMADOL HCL 50 MG PO TABS: 50.0000 mg | ORAL_TABLET | Freq: Three times a day (TID) | ORAL | 0 refills | Status: DC | PRN

## 2022-04-08 NOTE — Assessment & Plan Note (Signed)
Steven Dalton is here for Euflexxa 3 of 3 both knees, still has a lot of pain, we discussed at the last visit Celebrex twice a day, Tylenol arthritis strength twice a day, potentially tramadol. Too heavy for arthroplasty, genicular RFA would be an option.

## 2022-04-08 NOTE — Patient Instructions (Signed)
GLP-1 injections include Wegovy, Mounjaro, Ozempic

## 2022-04-08 NOTE — Assessment & Plan Note (Signed)
We are convoluted between a rock and a hard place, Steven Dalton at some point will probably need a knee arthroplasty, but we do need to get BMI down below 40, I would like him to discuss GLP-1 type injections with his PCP.

## 2022-04-08 NOTE — Progress Notes (Signed)
    Procedures performed today:    Procedure: Real-time Ultrasound Guided injection of the left knee Device: Samsung HS60  Verbal informed consent obtained.  Time-out conducted.  Noted no overlying erythema, induration, or other signs of local infection.  Skin prepped in a sterile fashion.  Local anesthesia: Topical Ethyl chloride.  With sterile technique and under real time ultrasound guidance: Noted mild effusion, 1 syringe of Euflexxa injected into the suprapatellar recess via a 22-gauge needle. Completed without difficulty  Advised to call if fevers/chills, erythema, induration, drainage, or persistent bleeding.  Images permanently stored and available for review in PACS.  Impression: Technically successful ultrasound guided injection.   Procedure: Real-time Ultrasound Guided aspiration/injection of the right knee Device: Samsung HS60  Verbal informed consent obtained.  Time-out conducted.  Noted no overlying erythema, induration, or other signs of local infection.  Skin prepped in a sterile fashion.  Local anesthesia: Topical Ethyl chloride.  With sterile technique and under real time ultrasound guidance: Noted large effusion, aspirated 30 mL of clear, straw-colored fluid, syringe switched and 1 syringe of Euflexxa injected into the suprapatellar recess. Completed without difficulty  Advised to call if fevers/chills, erythema, induration, drainage, or persistent bleeding.  Images permanently stored and available for review in PACS.  Impression: Technically successful ultrasound guided aspiration/injection.  Independent interpretation of notes and tests performed by another provider:   None.  Brief History, Exam, Impression, and Recommendations:    Primary osteoarthritis of both knees Theodoro Grist is here for Euflexxa 3 of 3 both knees, still has a lot of pain, we discussed at the last visit Celebrex twice a day, Tylenol arthritis strength twice a day, potentially tramadol. Too  heavy for arthroplasty, genicular RFA would be an option.  Morbid obesity (HCC) We are convoluted between a rock and a hard place, Theodoro Grist at some point will probably need a knee arthroplasty, but we do need to get BMI down below 40, I would like him to discuss GLP-1 type injections with his PCP.    ____________________________________________ [name]. [name], M.D., ABFM., CAQSM., AME. Primary Care and Sports Medicine [place] [facility]  Adjunct Professor of Family Medicine  [place]  Restaurant manager, fast food

## 2022-05-06 ENCOUNTER — Ambulatory Visit (INDEPENDENT_AMBULATORY_CARE_PROVIDER_SITE_OTHER): Payer: 59 | Admitting: Sports Medicine

## 2022-05-06 ENCOUNTER — Ambulatory Visit (INDEPENDENT_AMBULATORY_CARE_PROVIDER_SITE_OTHER): Payer: 59

## 2022-05-06 DIAGNOSIS — M170 Bilateral primary osteoarthritis of knee: Secondary | ICD-10-CM

## 2022-05-06 MED ORDER — TRAMADOL HCL 50 MG PO TABS: 50.0000 mg | ORAL_TABLET | Freq: Three times a day (TID) | ORAL | 3 refills | Status: DC | PRN

## 2022-05-06 NOTE — Progress Notes (Signed)
    Procedures performed today:    Procedure: Real-time Ultrasound Guided aspiration of right knee Device: Samsung HS60  Verbal informed consent obtained.  Time-out conducted.  Noted no overlying erythema, induration, or other signs of local infection.  Skin prepped in a sterile fashion.  Local anesthesia: Topical Ethyl chloride.  With sterile technique and under real time ultrasound guidance: Noted effusion, using 18-gauge needle aspirated 26 mils of clear, straw-colored fluid, syringe switched and 1 cc kenalog 40, 2 cc lidocaine, 2 cc bupivacaine injected easily. Completed without difficulty  Advised to call if fevers/chills, erythema, induration, drainage, or persistent bleeding.  Images permanently stored and available for review in PACS.  Impression: Technically successful ultrasound guided therapeutic aspiration/injection.   Procedure: Real-time Ultrasound Guided injection of left knee Device: Samsung HS60  Verbal informed consent obtained.  Time-out conducted.  Noted no overlying erythema, induration, or other signs of local infection.  Skin prepped in a sterile fashion.  Local anesthesia: Topical Ethyl chloride.  With sterile technique and under real time ultrasound guidance: Noted trace effusion, 1 cc kenalog 40, 2 cc lidocaine, 2 cc bupivacaine injected easily. Completed without difficulty  Advised to call if fevers/chills, erythema, induration, drainage, or persistent bleeding.  Images permanently stored and available for review in PACS.  Impression: Technically successful ultrasound guided therapeutic injection.  Independent interpretation of notes and tests performed by another provider:   None.  Brief History, Exam, Impression, and Recommendations:    Primary osteoarthritis of both knees Steven Dalton returns, he is a very pleasant 61 year old male, at this point we have done a lot of conservative treatment including steroid injections, aspirations, we just finished a series  of Euflexxa in both knees. He has also had a genicular RFA with Dr. Laurian Brim in the past. He has been on Celebrex twice a day, arthritis from Tylenol twice a day. Unfortunately he did not get any relief from the Euflexxa series. Tramadol was minimally efficacious, I am going to refill this for use up to 3 times daily, he understands that he can take up to 6 total pills in a day, he will fill it out and let me know how many pills he needs. He is not a candidate for arthroplasty due to his BMI. He is working with his PCP on weight loss, but this will take some time, in the meantime we will drain and inject his knees again today, I would like him to follow-up again with Dr. Laurian Brim to discuss RFA versus cooled ablation of the genicular nerves, and we will refill the tramadol.    ____________________________________________ [name]. [name], M.D., ABFM., CAQSM., AME. Primary Care and Sports Medicine [place] [facility]  Adjunct Professor of Family Medicine  [place] [employer]  Restaurant manager, fast food

## 2022-05-06 NOTE — Assessment & Plan Note (Signed)
Steven Dalton returns, he is a very pleasant 61 year old male, at this point we have done a lot of conservative treatment including steroid injections, aspirations, we just finished a series of Euflexxa in both knees. He has also had a genicular RFA with Dr. Laurian Brim in the past. He has been on Celebrex twice a day, arthritis from Tylenol twice a day. Unfortunately he did not get any relief from the Euflexxa series. Tramadol was minimally efficacious, I am going to refill this for use up to 3 times daily, he understands that he can take up to 6 total pills in a day, he will fill it out and let me know how many pills he needs. He is not a candidate for arthroplasty due to his BMI. He is working with his PCP on weight loss, but this will take some time, in the meantime we will drain and inject his knees again today, I would like him to follow-up again with Dr. Laurian Brim to discuss RFA versus cooled ablation of the genicular nerves, and we will refill the tramadol.

## 2022-07-22 ENCOUNTER — Other Ambulatory Visit: Payer: Self-pay | Admitting: Sports Medicine

## 2022-07-22 DIAGNOSIS — M170 Bilateral primary osteoarthritis of knee: Secondary | ICD-10-CM

## 2022-07-23 ENCOUNTER — Ambulatory Visit (INDEPENDENT_AMBULATORY_CARE_PROVIDER_SITE_OTHER): Payer: 59

## 2022-07-23 ENCOUNTER — Ambulatory Visit (INDEPENDENT_AMBULATORY_CARE_PROVIDER_SITE_OTHER): Payer: 59 | Admitting: Sports Medicine

## 2022-07-23 DIAGNOSIS — M170 Bilateral primary osteoarthritis of knee: Secondary | ICD-10-CM | POA: Diagnosis not present

## 2022-07-23 MED ORDER — TRAMADOL HCL 50 MG PO TABS: 100.0000 mg | ORAL_TABLET | Freq: Three times a day (TID) | ORAL | 3 refills | Status: DC | PRN

## 2022-07-23 NOTE — Assessment & Plan Note (Addendum)
Steven Dalton returns, he at this point has done everything conservative including steroid injections, aspirations, Euflexxa, genicular RFA with Dr. Francesco Runner. He does have another appointment with Dr. Francesco Runner coming up for consideration of repeat ablation. He is on Celebrex twice a day, arthritis from Tylenol twice a day. Increasing tramadol to 100 mg 3 times daily. Not a candidate for arthroplasty due to BMI. He is continue to work with his medical providers on weight loss. We did bilateral therapeutic arthrocentesis today. Return to see me as needed.

## 2022-07-23 NOTE — Assessment & Plan Note (Signed)
Did see his PCP, referred to [facility] for consideration of GLP-1 treatment.

## 2022-07-23 NOTE — Progress Notes (Signed)
    Procedures performed today:    Procedure: Real-time Ultrasound Guided aspiration of left knee Device: Samsung HS60  Verbal informed consent obtained.  Time-out conducted.  Noted no overlying erythema, induration, or other signs of local infection.  Skin prepped in a sterile fashion.  Local anesthesia: Topical Ethyl chloride.  With sterile technique and under real time ultrasound guidance: Aspirated 25 mL of clear, straw-colored fluid. Completed without difficulty  Advised to call if fevers/chills, erythema, induration, drainage, or persistent bleeding.  Images permanently stored and available for review in PACS.  Impression: Technically successful ultrasound guided therapeutic arthrocentesis.  Procedure: Real-time Ultrasound Guided aspiration of right knee Device: Samsung HS60  Verbal informed consent obtained.  Time-out conducted.  Noted no overlying erythema, induration, or other signs of local infection.  Skin prepped in a sterile fashion.  Local anesthesia: Topical Ethyl chloride.  With sterile technique and under real time ultrasound guidance: Aspirated 30 mL of clear, straw-colored fluid. Completed without difficulty  Advised to call if fevers/chills, erythema, induration, drainage, or persistent bleeding.  Images permanently stored and available for review in PACS.  Impression: Technically successful ultrasound guided therapeutic arthrocentesis.  Independent interpretation of notes and tests performed by another provider:   None.  Brief History, Exam, Impression, and Recommendations:    Morbid obesity ([place]) Did see his PCP, referred to [facility] for consideration of GLP-1 treatment.  Primary osteoarthritis of both knees Waunita Schooner returns, he at this point has done everything conservative including steroid injections, aspirations, Euflexxa, genicular RFA with Dr. Francesco Runner. He does have another appointment with Dr. Francesco Runner coming up for consideration of repeat  ablation. He is on Celebrex twice a day, arthritis from Tylenol twice a day. Increasing tramadol to 100 mg 3 times daily. Not a candidate for arthroplasty due to BMI. He is continue to work with his medical providers on weight loss. We did bilateral therapeutic arthrocentesis today. Return to see me as needed.    ____________________________________________ Gwen Her. Dianah Field, M.D., ABFM., CAQSM., AME. Primary Care and Sports Medicine [place] [facility]  Adjunct Professor of [place] of [facility] of Medicine  Risk manager

## 2022-12-01 ENCOUNTER — Ambulatory Visit (INDEPENDENT_AMBULATORY_CARE_PROVIDER_SITE_OTHER): Payer: 59 | Admitting: Sports Medicine

## 2022-12-01 ENCOUNTER — Encounter: Payer: Self-pay | Admitting: Sports Medicine

## 2022-12-01 ENCOUNTER — Ambulatory Visit (INDEPENDENT_AMBULATORY_CARE_PROVIDER_SITE_OTHER): Payer: 59

## 2022-12-01 VITALS — Ht 72.0 in | Wt 360.0 lb

## 2022-12-01 DIAGNOSIS — M170 Bilateral primary osteoarthritis of knee: Secondary | ICD-10-CM | POA: Diagnosis not present

## 2022-12-01 MED ORDER — ZEPBOUND 2.5 MG/0.5ML ~~LOC~~ SOAJ: 2.5000 mg | SUBCUTANEOUS | 0 refills | Status: DC

## 2022-12-01 MED ORDER — TRIAMCINOLONE ACETONIDE 40 MG/ML IJ SUSP
80.0000 mg | Freq: Once | INTRAMUSCULAR | Status: AC
  Administered 2022-12-01: 80 mg via INTRAMUSCULAR

## 2022-12-01 NOTE — Addendum Note (Signed)
Addended by: Tarri Glenn A on: 12/01/2022 04:20 PM   Modules accepted: Orders

## 2022-12-01 NOTE — Assessment & Plan Note (Signed)
Not getting much improvement with core life, we will go ahead and add Zepbound, if unable to get this approved we can try [facility]. Patient will be enrolled in a multidisciplinary weight loss program with calorie counting, and exercise prescription. I would like to see him after a month on the weight loss medication.

## 2022-12-01 NOTE — Progress Notes (Signed)
    Procedures performed today:    Procedure: Real-time Ultrasound Guided injection of left knee Device: Samsung HS60  Verbal informed consent obtained.  Time-out conducted.  Noted no overlying erythema, induration, or other signs of local infection.  Skin prepped in a sterile fashion.  Local anesthesia: Topical Ethyl chloride.  With sterile technique and under real time ultrasound guidance: Noted trace effusion, 1 cc kenalog 40, 2 cc lidocaine, 2 cc bupivacaine injected easily. Completed without difficulty  Advised to call if fevers/chills, erythema, induration, drainage, or persistent bleeding.  Images permanently stored and available for review in PACS.  Impression: Technically successful ultrasound guided injection.   Procedure: Real-time Ultrasound Guided aspiration/injection of right knee Device: Samsung HS60  Verbal informed consent obtained.  Time-out conducted.  Noted no overlying erythema, induration, or other signs of local infection.  Skin prepped in a sterile fashion.  Local anesthesia: Topical Ethyl chloride.  With sterile technique and under real time ultrasound guidance: Aspirated 36 mL of clear, straw-colored fluid, syringe switched and 1 cc kenalog 40, 2 cc lidocaine, 2 cc bupivacaine injected easily.. Completed without difficulty  Advised to call if fevers/chills, erythema, induration, drainage, or persistent bleeding.  Images permanently stored and available for review in PACS.  Impression: Technically successful ultrasound guided therapeutic aspiration/injection.  Independent interpretation of notes and tests performed by another provider:   None.  Brief History, Exam, Impression, and Recommendations:    Primary osteoarthritis of both knees Waunita Schooner returns, increasing knee pain, last aspiration injection was in October of last year, now with recurrence of pain. Today we did repeat aspirations and injections. Of note he has done everything conservative  including Euflexxa, genicular RFA with Dr. Francesco Runner. He takes Celebrex twice a day, arthritis strength Tylenol twice a day, tramadol 100 mg 3 times daily. He is not a candidate for arthroplasty due to BMI so he will continue to work on weight loss along the way.  Morbid obesity ([place]) Not getting much improvement with core life, we will go ahead and add Zepbound, if unable to get this approved we can try [facility]. Patient will be enrolled in a multidisciplinary weight loss program with calorie counting, and exercise prescription. I would like to see him after a month on the weight loss medication.    ____________________________________________ Gwen Her. Dianah Field, M.D., ABFM., CAQSM., AME. Primary Care and Sports Medicine [place] [facility]  Adjunct Professor of [place] of [facility] of Medicine  Risk manager

## 2022-12-01 NOTE — Assessment & Plan Note (Signed)
Steven Dalton returns, increasing knee pain, last aspiration injection was in October of last year, now with recurrence of pain. Today we did repeat aspirations and injections. Of note he has done everything conservative including Euflexxa, genicular RFA with Dr. Francesco Runner. He takes Celebrex twice a day, arthritis strength Tylenol twice a day, tramadol 100 mg 3 times daily. He is not a candidate for arthroplasty due to BMI so he will continue to work on weight loss along the way.

## 2022-12-29 ENCOUNTER — Ambulatory Visit (INDEPENDENT_AMBULATORY_CARE_PROVIDER_SITE_OTHER): Payer: 59 | Admitting: Sports Medicine

## 2022-12-29 ENCOUNTER — Other Ambulatory Visit (INDEPENDENT_AMBULATORY_CARE_PROVIDER_SITE_OTHER): Payer: 59

## 2022-12-29 ENCOUNTER — Encounter: Payer: Self-pay | Admitting: Sports Medicine

## 2022-12-29 VITALS — BP 186/69 | HR 97 | Wt 357.0 lb

## 2022-12-29 DIAGNOSIS — M170 Bilateral primary osteoarthritis of knee: Secondary | ICD-10-CM

## 2022-12-29 DIAGNOSIS — I10 Essential (primary) hypertension: Secondary | ICD-10-CM | POA: Insufficient documentation

## 2022-12-29 MED ORDER — VALSARTAN-HYDROCHLOROTHIAZIDE 320-25 MG PO TABS: 1.0000 | ORAL_TABLET | Freq: Every day | ORAL | 3 refills | Status: AC

## 2022-12-29 MED ORDER — ZEPBOUND 2.5 MG/0.5ML ~~LOC~~ SOAJ: 2.5000 mg | SUBCUTANEOUS | 0 refills | Status: DC

## 2022-12-29 MED ORDER — SEMAGLUTIDE (2 MG/DOSE) 8 MG/3ML ~~LOC~~ SOPN: PEN_INJECTOR | SUBCUTANEOUS | 3 refills | Status: DC

## 2022-12-29 NOTE — Assessment & Plan Note (Signed)
Uncontrolled, currently on lisinopril 40 and hydrochlorothiazide 25. He has been out of medication, switching him to valsartan/HCTZ as a combo pill. Follows up with PCP.

## 2022-12-29 NOTE — Assessment & Plan Note (Signed)
Has not gotten much improvement with core life, we added Zepbound but he has not heard back from his pharmacy, he will switch pharmacies, if unable to get Zepbound he will use compounded semag[facility]ide [facility] in [place], I will write him prescriptions for both.  For insurance coverage purposes he will be only on one weight loss medication, he will be enrolled in a multidisciplinary weight loss program with calorie counting and an exercise prescription.

## 2022-12-29 NOTE — Assessment & Plan Note (Signed)
Steven Dalton returns, lots of knee pain, we did everything conservative including a series of Euflexxa, genicular RFA with Dr. Francesco Runner, Celebrex twice a day, arthritis from Tylenol twice a day, tramadol 100 mg 3 times daily. Not a candidate for arthroplasty due to BMI so we had decided to help him aggressively work on weight loss. We did an aspiration and injection bilaterally month, today he has an effusion on the right, we did an arthrocentesis. Return to see me as needed.

## 2022-12-29 NOTE — Progress Notes (Addendum)
    Procedures performed today:    Procedure: Real-time Ultrasound Guided aspiration of the right knee Device: Samsung HS60  Verbal informed consent obtained.  Time-out conducted.  Noted no overlying erythema, induration, or other signs of local infection.  Skin prepped in a sterile fashion.  Local anesthesia: Topical Ethyl chloride.  With sterile technique and under real time ultrasound guidance: Noted effusion, using an 18-gauge needle aspirated 38 mL of clear, straw-colored fluid. Completed without difficulty  Advised to call if fevers/chills, erythema, induration, drainage, or persistent bleeding.  Images permanently stored and available for review in PACS.  Impression: Technically successful ultrasound guided therapeutic arthrocentesis.  Independent interpretation of notes and tests performed by another provider:   None.  Brief History, Exam, Impression, and Recommendations:    Primary osteoarthritis of both knees Steven Dalton returns, lots of knee pain, we did everything conservative including a series of Euflexxa, genicular RFA with Dr. Francesco Runner, Celebrex twice a day, arthritis from Tylenol twice a day, tramadol 100 mg 3 times daily. Not a candidate for arthroplasty due to BMI so we had decided to help him aggressively work on weight loss. We did an aspiration and injection bilaterally month, today he has an effusion on the right, we did an arthrocentesis. Return to see me as needed.  Morbid obesity ([place]) Has not gotten much improvement with core life, we added Zepbound but he has not heard back from his pharmacy, he will switch pharmacies, if unable to get Zepbound he will use compounded semaglutide [facility] in [place], I will write him prescriptions for both.  For insurance coverage purposes he will be only on one weight loss medication, he will be enrolled in a multidisciplinary weight loss program with calorie counting and an exercise  prescription.  Benign essential hypertension Uncontrolled, currently on lisinopril 40 and hydrochlorothiazide 25. He has been out of medication, switching him to valsartan/HCTZ as a combo pill. Follows up with PCP.    ____________________________________________ Gwen Her. Dianah Field, M.D., ABFM., CAQSM., AME. Primary Care and Sports Medicine [place] [facility]  Adjunct Professor of [place] of [facility] of Medicine  Risk manager

## 2023-01-26 ENCOUNTER — Ambulatory Visit: Payer: 59 | Admitting: Sports Medicine

## 2023-02-17 ENCOUNTER — Ambulatory Visit: Payer: 59 | Admitting: Sports Medicine

## 2023-03-12 ENCOUNTER — Other Ambulatory Visit: Payer: Self-pay | Admitting: Sports Medicine

## 2023-03-12 DIAGNOSIS — M170 Bilateral primary osteoarthritis of knee: Secondary | ICD-10-CM

## 2023-03-12 NOTE — Telephone Encounter (Signed)
Last OV: 12/29/22 Next OV: no appt scheduled Last RF: 07/23/22

## 2023-06-21 ENCOUNTER — Ambulatory Visit (INDEPENDENT_AMBULATORY_CARE_PROVIDER_SITE_OTHER): Payer: 59 | Admitting: Sports Medicine

## 2023-06-21 ENCOUNTER — Encounter: Payer: Self-pay | Admitting: Sports Medicine

## 2023-06-21 DIAGNOSIS — M170 Bilateral primary osteoarthritis of knee: Secondary | ICD-10-CM | POA: Diagnosis not present

## 2023-06-21 MED ORDER — ZEPBOUND 2.5 MG/0.5ML ~~LOC~~ SOAJ: 2.5000 mg | SUBCUTANEOUS | 0 refills | Status: AC

## 2023-06-21 NOTE — Assessment & Plan Note (Signed)
Steven Dalton returns, he is a pleasant 62 year old male, he has had valiant effort to treat his knee arthritis nonsurgically, he has had a series of Euflexxa, genicular RFA with Dr. Laurian Brim, Celebrex twice a day, arthritis Tylenol twice a day, tramadol 100 mg 3 times a day, multiple steroid injections. Unfortunately nothing has worked, he is now scheduled for arthroplasty, he was looking to an arthrocentesis today but he is within the 90-day waiting period prior to replacement.   He can return to see me as needed for this.

## 2023-06-21 NOTE — Assessment & Plan Note (Signed)
Morbidly obese with comorbidities including hypertension, knee osteoarthritis. He has tried months of calorie counting, exercise prescription, through a multidisciplinary approach. He has not had success with semaglutide. I would like to try to get him approved for Zepbound.

## 2023-06-21 NOTE — Progress Notes (Signed)
    Procedures performed today:    None.  Independent interpretation of notes and tests performed by another provider:   None.  Brief History, Exam, Impression, and Recommendations:    Morbid obesity ([facility]) Morbidly obese with comorbidities including hypertension, knee osteoarthritis. He has tried months of calorie counting, exercise prescription, through a multidisciplinary approach. He has not had success with semaglutide. I would like to try to get him approved for Zepbound.  Primary osteoarthritis of both knees Steven Dalton returns, he is a pleasant 62 year old male, he has had valiant effort to treat his knee arthritis nonsurgically, he has had a series of Euflexxa, genicular RFA with Dr. Laurian Brim, Celebrex twice a day, arthritis Tylenol twice a day, tramadol 100 mg 3 times a day, multiple steroid injections. Unfortunately nothing has worked, he is now scheduled for arthroplasty, he was looking to an arthrocentesis today but he is within the 90-day waiting period prior to replacement.   He can return to see me as needed for this.    ____________________________________________ [name]. [name], M.D., ABFM., CAQSM., AME. Primary Care and Sports Medicine [place] [facility]  Adjunct Professor of Family Medicine  [place] [employer] [facility] of Medicine  Restaurant manager, fast food

## 2023-08-12 ENCOUNTER — Other Ambulatory Visit: Payer: Self-pay | Admitting: Sports Medicine

## 2023-08-12 DIAGNOSIS — M170 Bilateral primary osteoarthritis of knee: Secondary | ICD-10-CM

## 2023-10-21 IMAGING — DX DG KNEE COMPLETE 4+V*R*
4 series · 4 of 4 positions shown · non-contrast
Comparison: None.

CLINICAL DATA: Chronic bilateral knee pain.

EXAM:
LEFT KNEE - COMPLETE 4+ VIEW; RIGHT KNEE - COMPLETE 4+ VIEW

[knee ap]
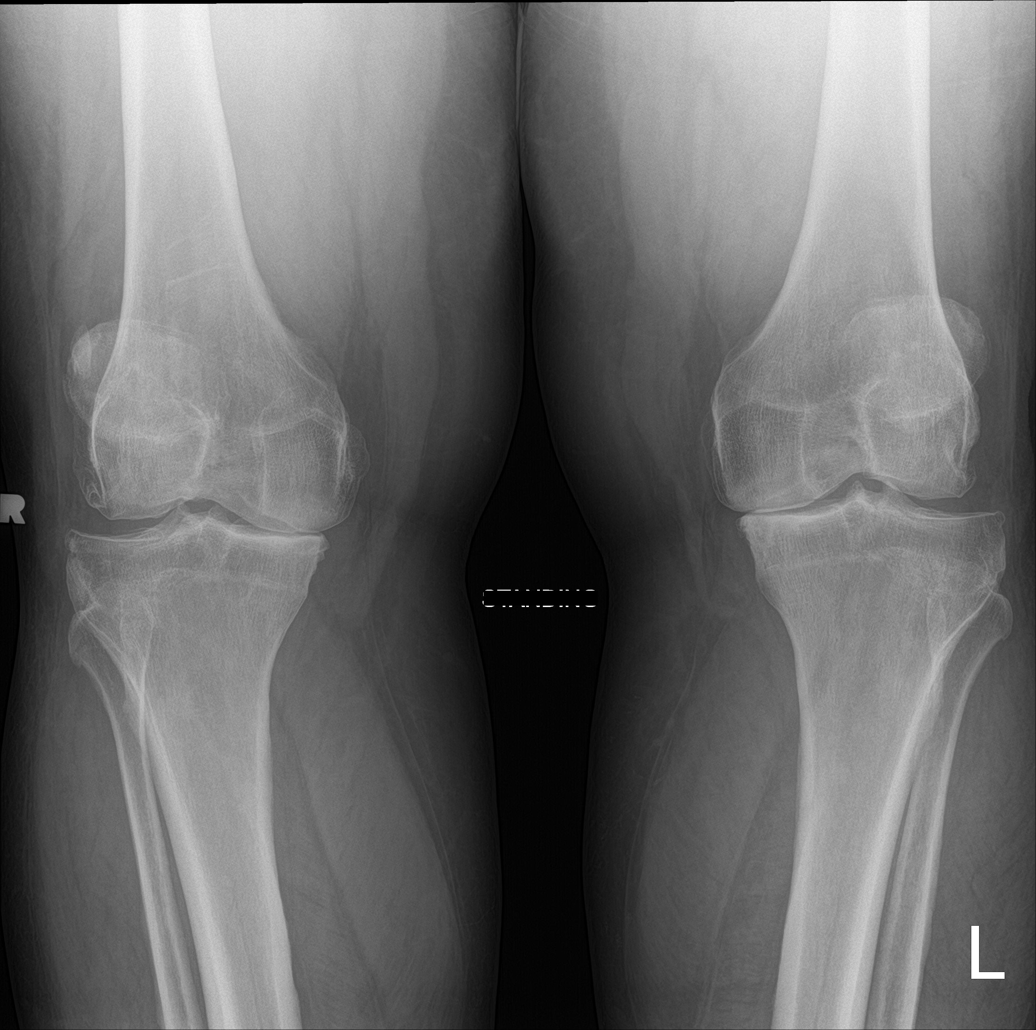

[knee lat]
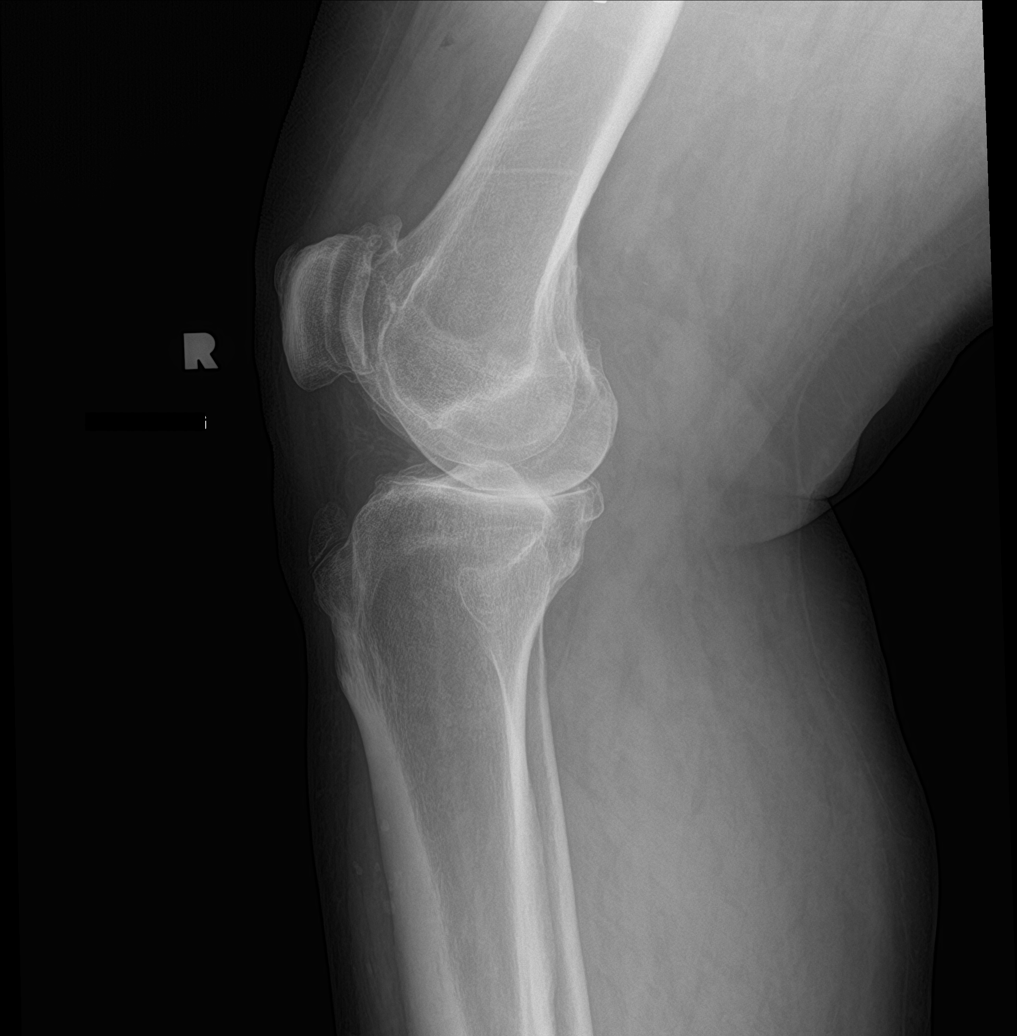

[knee ap bilat standing]
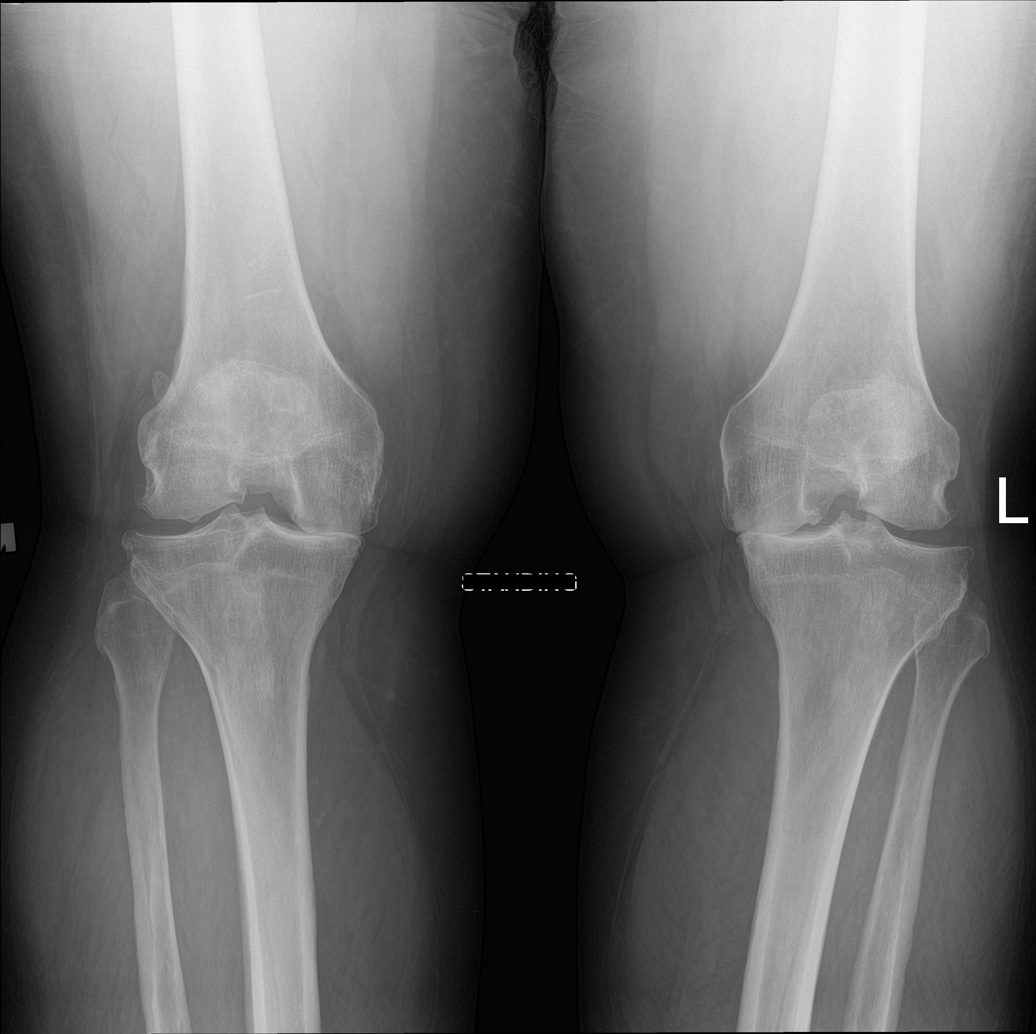

[knee sunrise standing]
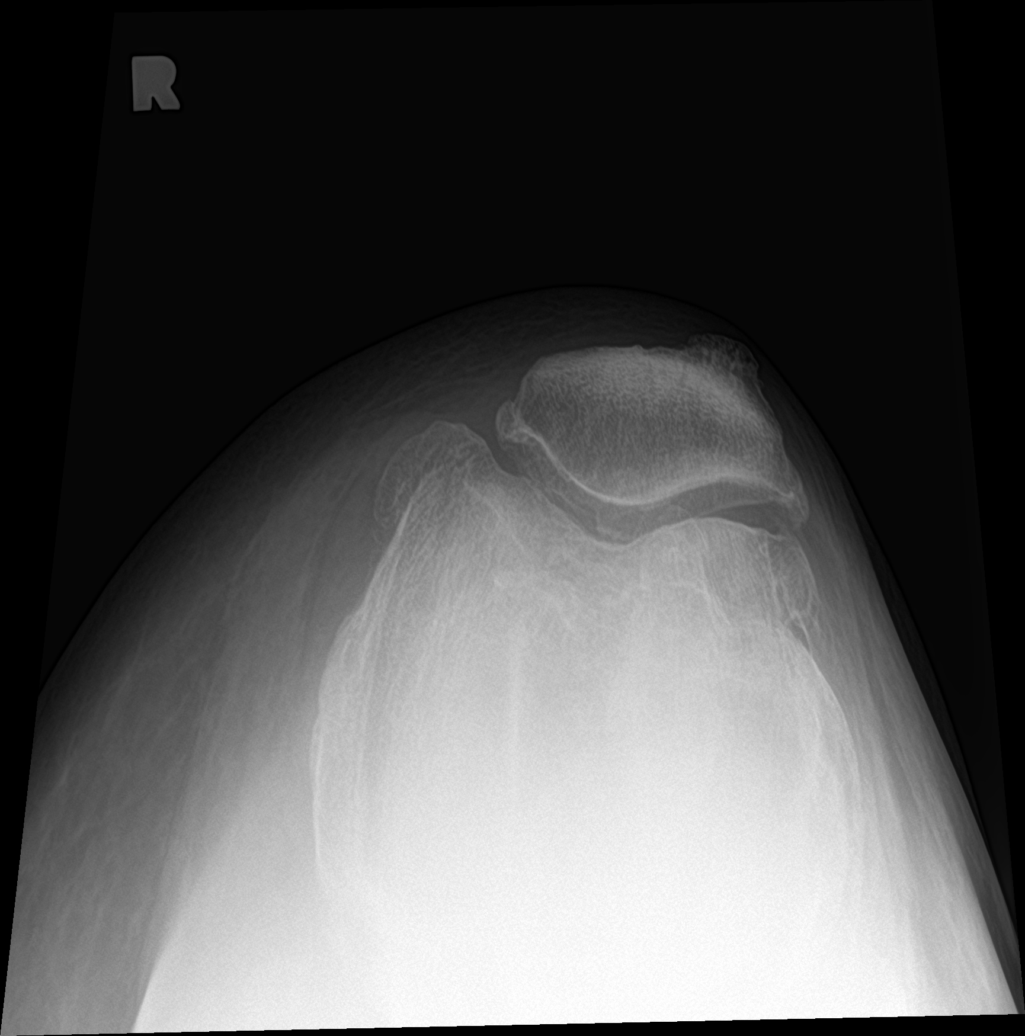

[4 of 4 positions shown; findings below may reference images not displayed]

FINDINGS: Right knee:

Severe medial compartment joint space narrowing. Mild peripheral
degenerative osteophytosis. Moderate peripheral lateral compartment
degenerative osteophytosis. Severe patellofemoral joint space
narrowing and peripheral osteophytosis. No acute fracture or
dislocation. Small joint effusion. There is a chronic ossicle with
well corticated margins measuring up to approximately 2.2 cm at the
superior aspect of the tibial tubercle, likely the sequela of remote
Osgood-Schlatter disease.

Left knee:

Severe medial compartment joint space narrowing. Mild peripheral
lateral compartment degenerative osteophytosis. Moderate
patellofemoral joint space narrowing and peripheral osteophytosis.
No joint effusion. No acute fracture or dislocation. Small chronic
ossicle at the superior aspect of the tibial tubercle likely the
sequela of Osgood-Schlatter disease.
IMPRESSION: :
IMPRESSION: 1. Severe right-greater-than-left medial compartment osteoarthritis.
2. Severe right and moderate left patellofemoral osteoarthritis.
3. Left-greater-than-right superior tibial tubercle chronic
ossicles, likely the sequela of remote Osgood Schlatter disease.

## 2023-12-23 ENCOUNTER — Other Ambulatory Visit (HOSPITAL_BASED_OUTPATIENT_CLINIC_OR_DEPARTMENT_OTHER): Payer: Self-pay

## 2023-12-23 MED ORDER — ZEPBOUND 2.5 MG/0.5ML ~~LOC~~ SOAJ
2.5000 mg | SUBCUTANEOUS | 0 refills | Status: DC
  Filled 2023-12-23: qty 2, 28d supply, fill #0

## 2024-01-17 ENCOUNTER — Other Ambulatory Visit (HOSPITAL_BASED_OUTPATIENT_CLINIC_OR_DEPARTMENT_OTHER): Payer: Self-pay

## 2024-01-17 MED ORDER — ZEPBOUND 2.5 MG/0.5ML ~~LOC~~ SOAJ
2.5000 mg | SUBCUTANEOUS | 0 refills | Status: AC
  Filled 2024-01-17: qty 2, 28d supply, fill #0

## 2024-01-21 ENCOUNTER — Encounter: Payer: Self-pay | Admitting: Sports Medicine

## 2024-01-21 ENCOUNTER — Ambulatory Visit: Admitting: Sports Medicine

## 2024-01-21 ENCOUNTER — Other Ambulatory Visit (INDEPENDENT_AMBULATORY_CARE_PROVIDER_SITE_OTHER)

## 2024-01-21 DIAGNOSIS — M1712 Unilateral primary osteoarthritis, left knee: Secondary | ICD-10-CM

## 2024-01-21 MED ORDER — TRIAMCINOLONE ACETONIDE 40 MG/ML IJ SUSP
40.0000 mg | Freq: Once | INTRAMUSCULAR | Status: AC
  Administered 2024-01-21: 40 mg via INTRAMUSCULAR

## 2024-01-21 NOTE — Progress Notes (Signed)
    Procedures performed today:    Procedure: Real-time Ultrasound Guided aspiration/injection of the left knee Device: Samsung HS60  Verbal informed consent obtained.  Time-out conducted.  Noted no overlying erythema, induration, or other signs of local infection.  Skin prepped in a sterile fashion.  Local anesthesia: Topical Ethyl chloride.  With sterile technique and under real time ultrasound guidance: Noted effusion, using an 18-gauge needle aspirated 20 mL of clear, straw-colored fluid syringe switched and 1 cc kenalog 40, 2 cc lidocaine, 2 cc bupivacaine injected easily. Completed without difficulty  Advised to call if fevers/chills, erythema, induration, drainage, or persistent bleeding.  Images permanently stored and available for review in PACS.  Impression: Technically successful ultrasound guided aspiration/injection  Independent interpretation of notes and tests performed by another provider:   None.  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 Laurian Brim, tramadol max dose. Nothing worked, so ultimately he did get a right total knee arthroplasty. He is doing really well, excellent flexion. He is having increasing pain left knee, today we did an aspiration and injection, return to see me as needed. He is looking into left knee arthroplasty sometime in the fall.    ____________________________________________ [name]. [name], M.D., ABFM., CAQSM., AME. Primary Care and Sports Medicine [place] [facility]  Adjunct Professor of Family Medicine  [place] of [facility] of Medicine  Stage manager

## 2024-01-21 NOTE — Assessment & Plan Note (Signed)
 This is a very pleasant 63 year old male, last year we put forth a valiant effort trying to treat his knee arthritis nonsurgically, he had Euflexxa, multiple steroid injections, Celebrex and Tylenol, genicular radiofrequency ablation with Dr. Laurian Brim, tramadol max dose. Nothing worked, so ultimately he did get a right total knee arthroplasty. He is doing really well, excellent flexion. He is having increasing pain left knee, today we did an aspiration and injection, return to see me as needed. He is looking into left knee arthroplasty sometime in the fall.

## 2024-02-15 ENCOUNTER — Other Ambulatory Visit (HOSPITAL_BASED_OUTPATIENT_CLINIC_OR_DEPARTMENT_OTHER): Payer: Self-pay

## 2024-02-15 MED ORDER — ZEPBOUND 5 MG/0.5ML ~~LOC~~ SOAJ
5.0000 mg | SUBCUTANEOUS | 0 refills | Status: AC
Start: 2024-02-15 — End: ?
  Filled 2024-02-15: qty 2, 28d supply, fill #0

## 2024-04-20 ENCOUNTER — Other Ambulatory Visit (HOSPITAL_BASED_OUTPATIENT_CLINIC_OR_DEPARTMENT_OTHER): Payer: Self-pay

## 2024-04-20 MED ORDER — ZEPBOUND 7.5 MG/0.5ML ~~LOC~~ SOAJ
7.5000 mg | SUBCUTANEOUS | 0 refills | Status: AC
  Filled 2024-04-20 – 2024-06-14 (×2): qty 2, 28d supply, fill #0

## 2024-04-21 ENCOUNTER — Other Ambulatory Visit (HOSPITAL_BASED_OUTPATIENT_CLINIC_OR_DEPARTMENT_OTHER): Payer: Self-pay

## 2024-04-24 ENCOUNTER — Other Ambulatory Visit (HOSPITAL_BASED_OUTPATIENT_CLINIC_OR_DEPARTMENT_OTHER): Payer: Self-pay

## 2024-04-25 ENCOUNTER — Other Ambulatory Visit (HOSPITAL_BASED_OUTPATIENT_CLINIC_OR_DEPARTMENT_OTHER): Payer: Self-pay

## 2024-04-27 ENCOUNTER — Other Ambulatory Visit (HOSPITAL_BASED_OUTPATIENT_CLINIC_OR_DEPARTMENT_OTHER): Payer: Self-pay

## 2024-04-28 ENCOUNTER — Other Ambulatory Visit (HOSPITAL_BASED_OUTPATIENT_CLINIC_OR_DEPARTMENT_OTHER): Payer: Self-pay

## 2024-04-29 ENCOUNTER — Other Ambulatory Visit (HOSPITAL_BASED_OUTPATIENT_CLINIC_OR_DEPARTMENT_OTHER): Payer: Self-pay

## 2024-05-01 ENCOUNTER — Other Ambulatory Visit (HOSPITAL_BASED_OUTPATIENT_CLINIC_OR_DEPARTMENT_OTHER): Payer: Self-pay

## 2024-05-02 ENCOUNTER — Other Ambulatory Visit (HOSPITAL_BASED_OUTPATIENT_CLINIC_OR_DEPARTMENT_OTHER): Payer: Self-pay

## 2024-05-03 ENCOUNTER — Other Ambulatory Visit (HOSPITAL_BASED_OUTPATIENT_CLINIC_OR_DEPARTMENT_OTHER): Payer: Self-pay

## 2024-05-04 ENCOUNTER — Other Ambulatory Visit (HOSPITAL_BASED_OUTPATIENT_CLINIC_OR_DEPARTMENT_OTHER): Payer: Self-pay

## 2024-05-05 ENCOUNTER — Other Ambulatory Visit (HOSPITAL_BASED_OUTPATIENT_CLINIC_OR_DEPARTMENT_OTHER): Payer: Self-pay

## 2024-05-08 ENCOUNTER — Other Ambulatory Visit (HOSPITAL_BASED_OUTPATIENT_CLINIC_OR_DEPARTMENT_OTHER): Payer: Self-pay

## 2024-05-09 ENCOUNTER — Other Ambulatory Visit (HOSPITAL_BASED_OUTPATIENT_CLINIC_OR_DEPARTMENT_OTHER): Payer: Self-pay

## 2024-05-11 ENCOUNTER — Other Ambulatory Visit (HOSPITAL_BASED_OUTPATIENT_CLINIC_OR_DEPARTMENT_OTHER): Payer: Self-pay

## 2024-05-12 ENCOUNTER — Other Ambulatory Visit (HOSPITAL_BASED_OUTPATIENT_CLINIC_OR_DEPARTMENT_OTHER): Payer: Self-pay

## 2024-05-15 ENCOUNTER — Other Ambulatory Visit (HOSPITAL_BASED_OUTPATIENT_CLINIC_OR_DEPARTMENT_OTHER): Payer: Self-pay

## 2024-05-16 ENCOUNTER — Other Ambulatory Visit (HOSPITAL_BASED_OUTPATIENT_CLINIC_OR_DEPARTMENT_OTHER): Payer: Self-pay

## 2024-05-17 ENCOUNTER — Other Ambulatory Visit (HOSPITAL_BASED_OUTPATIENT_CLINIC_OR_DEPARTMENT_OTHER): Payer: Self-pay

## 2024-05-18 ENCOUNTER — Other Ambulatory Visit (HOSPITAL_BASED_OUTPATIENT_CLINIC_OR_DEPARTMENT_OTHER): Payer: Self-pay

## 2024-05-19 ENCOUNTER — Other Ambulatory Visit (HOSPITAL_BASED_OUTPATIENT_CLINIC_OR_DEPARTMENT_OTHER): Payer: Self-pay

## 2024-05-22 ENCOUNTER — Other Ambulatory Visit (HOSPITAL_BASED_OUTPATIENT_CLINIC_OR_DEPARTMENT_OTHER): Payer: Self-pay

## 2024-06-13 ENCOUNTER — Encounter: Payer: Self-pay | Admitting: Sports Medicine

## 2024-06-14 ENCOUNTER — Other Ambulatory Visit (HOSPITAL_BASED_OUTPATIENT_CLINIC_OR_DEPARTMENT_OTHER): Payer: Self-pay

## 2024-06-15 ENCOUNTER — Other Ambulatory Visit (HOSPITAL_BASED_OUTPATIENT_CLINIC_OR_DEPARTMENT_OTHER): Payer: Self-pay

## 2024-06-16 ENCOUNTER — Other Ambulatory Visit (HOSPITAL_BASED_OUTPATIENT_CLINIC_OR_DEPARTMENT_OTHER): Payer: Self-pay

## 2024-06-19 ENCOUNTER — Other Ambulatory Visit (HOSPITAL_BASED_OUTPATIENT_CLINIC_OR_DEPARTMENT_OTHER): Payer: Self-pay

## 2024-06-20 ENCOUNTER — Other Ambulatory Visit (HOSPITAL_BASED_OUTPATIENT_CLINIC_OR_DEPARTMENT_OTHER): Payer: Self-pay

## 2024-06-21 ENCOUNTER — Other Ambulatory Visit (HOSPITAL_BASED_OUTPATIENT_CLINIC_OR_DEPARTMENT_OTHER): Payer: Self-pay

## 2024-06-22 ENCOUNTER — Other Ambulatory Visit (HOSPITAL_BASED_OUTPATIENT_CLINIC_OR_DEPARTMENT_OTHER): Payer: Self-pay

## 2024-06-23 ENCOUNTER — Other Ambulatory Visit (HOSPITAL_BASED_OUTPATIENT_CLINIC_OR_DEPARTMENT_OTHER): Payer: Self-pay
